# Patient Record
Sex: Female | Born: 2000 | Race: White | Hispanic: No | Marital: Single | Smoking: Never smoker
Health system: Southern US, Community
[De-identification: ages and names within clinical notes are randomized; demographics above are authoritative.]

---
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
## 2023-02-01 ENCOUNTER — Ambulatory Visit (INDEPENDENT_AMBULATORY_CARE_PROVIDER_SITE_OTHER): Payer: BC Managed Care – PPO | Admitting: Obstetrics

## 2023-02-01 ENCOUNTER — Encounter: Payer: Self-pay | Admitting: Obstetrics

## 2023-02-01 VITALS — BP 124/81 | HR 99 | Ht 67.0 in | Wt 135.0 lb

## 2023-02-01 DIAGNOSIS — Z3202 Encounter for pregnancy test, result negative: Secondary | ICD-10-CM | POA: Diagnosis not present

## 2023-02-01 DIAGNOSIS — Z30430 Encounter for insertion of intrauterine contraceptive device: Secondary | ICD-10-CM | POA: Diagnosis not present

## 2023-02-01 DIAGNOSIS — Z7689 Persons encountering health services in other specified circumstances: Secondary | ICD-10-CM

## 2023-02-01 LAB — POCT URINE PREGNANCY: Preg Test, Ur: NEGATIVE

## 2023-02-01 MED ORDER — LEVONORGESTREL 19.5 MG IU IUD: INTRAUTERINE_SYSTEM | Freq: Once | INTRAUTERINE | Status: AC

## 2023-02-01 NOTE — Progress Notes (Signed)
   ENCOUNTER FOR IUD INSERTION   Subjective  Bianca Clark is a 22 y.o. [id number] who presents today for IUD insertion. She desires reversible long-term contraception. We have thoroughly reviewed the risks, benefits, and alternatives, and she has elected to proceed with  [facility]  insertion.   Objective BP 124/81   Pulse 99   Ht 5\' 7"  (1.702 m)   Wt 135 lb (61.2 kg)   LMP 01/18/2023   BMI 21.14 kg/m   UPT: negative Pelvic exam: normal external genitalia, vulva, vagina, cervix, uterus and adnexa, VULVA: normal appearing vulva with no masses, tenderness or lesions, VAGINA: normal appearing vagina with normal color and discharge, no lesions, CERVIX: normal appearing cervix without discharge or lesions, UTERUS: uterus is normal size, shape, consistency and nontender.   Procedure Note Consent was obtained prior to the procedure. A bimanual exam was performed to determine the position of the uterus. A sterile speculum was placed in the vagina, and the cervix was visualized. Betadine was applied to the cervix followed by 4% lidocaine. A single-toothed tenaculum was placed on the anterior lip of the cervix, and gentle traction was applied to straighten and stabilize it. The uterus was sounded to about 6.5 cm. The IUD was inserted to the appropriate depth and the insertion tool was removed. The strings were trimmed to about 2 cm. Bleeding was minimal. [name] tolerated the procedure well. Post-procedure care and warning signs were reviewed with [name].  Follow up for annual visit or PRN.  [name] Spanish, CNM

## 2024-01-13 ENCOUNTER — Emergency Department

## 2024-01-13 ENCOUNTER — Other Ambulatory Visit: Payer: Self-pay

## 2024-01-13 ENCOUNTER — Emergency Department
Admission: EM | Admit: 2024-01-13 | Discharge: 2024-01-13 | Disposition: A | Discharge status: Home / Self Care | Attending: Emergency Medicine | Admitting: Emergency Medicine

## 2024-01-13 DIAGNOSIS — Y908 Blood alcohol level of 240 mg/100 ml or more: Secondary | ICD-10-CM | POA: Insufficient documentation

## 2024-01-13 DIAGNOSIS — F10920 Alcohol use, unspecified with intoxication, uncomplicated: Secondary | ICD-10-CM | POA: Insufficient documentation

## 2024-01-13 DIAGNOSIS — R4182 Altered mental status, unspecified: Secondary | ICD-10-CM | POA: Diagnosis present

## 2024-01-13 LAB — HEPATIC FUNCTION PANEL
ALT: 16 U/L (ref 0–44)
AST: 21 U/L (ref 15–41)
Albumin: 4.3 g/dL (ref 3.5–5.0)
Alkaline Phosphatase: 55 U/L (ref 38–126)
Bilirubin, Direct: <0.1 mg/dL (ref 0.0–0.2)
Total Bilirubin: 0.6 mg/dL (ref 0.0–1.2)
Total Protein: 7.5 g/dL (ref 6.5–8.1)

## 2024-01-13 LAB — BASIC METABOLIC PANEL WITH GFR
Anion gap: 12 (ref 5–15)
BUN: 9 mg/dL (ref 6–20)
CO2: 20 mmol/L — ABNORMAL LOW (ref 22–32)
Calcium: 8.9 mg/dL (ref 8.9–10.3)
Chloride: 109 mmol/L (ref 98–111)
Creatinine, Ser: 0.53 mg/dL (ref 0.44–1.00)
GFR, Estimated: >60 mL/min (ref >60)
Glucose, Bld: 116 mg/dL — ABNORMAL HIGH (ref 70–99)
Potassium: 3.3 mmol/L — ABNORMAL LOW (ref 3.5–5.1)
Sodium: 141 mmol/L (ref 135–145)

## 2024-01-13 LAB — CBC WITH DIFFERENTIAL/PLATELET
Abs Immature Granulocytes: 0.02 K/uL (ref 0.00–0.07)
Basophils Absolute: 0 K/uL (ref 0.0–0.1)
Basophils Relative: 1 %
Eosinophils Absolute: 0 K/uL (ref 0.0–0.5)
Eosinophils Relative: 1 %
HCT: 36.6 % (ref 36.0–46.0)
Hemoglobin: 12.8 g/dL (ref 12.0–15.0)
Immature Granulocytes: 0 %
Lymphocytes Relative: 52 %
Lymphs Abs: 3.7 K/uL (ref 0.7–4.0)
MCH: 31.6 pg (ref 26.0–34.0)
MCHC: 35 g/dL (ref 30.0–36.0)
MCV: 90.4 fL (ref 80.0–100.0)
Monocytes Absolute: 0.3 K/uL (ref 0.1–1.0)
Monocytes Relative: 5 %
Neutro Abs: 2.8 K/uL (ref 1.7–7.7)
Neutrophils Relative %: 41 %
Platelets: 268 K/uL (ref 150–400)
RBC: 4.05 MIL/uL (ref 3.87–5.11)
RDW: 11.8 % (ref 11.5–15.5)
WBC: 6.9 K/uL (ref 4.0–10.5)
nRBC: 0 % (ref 0.0–0.2)

## 2024-01-13 LAB — ETHANOL: Alcohol, Ethyl (B): 290 mg/dL — ABNORMAL HIGH (ref <15)

## 2024-01-13 LAB — LIPASE, BLOOD: Lipase: 60 U/L — ABNORMAL HIGH (ref 11–51)

## 2024-01-13 LAB — HCG, QUANTITATIVE, PREGNANCY: hCG, Beta Chain, Quant, S: <1 m[IU]/mL (ref <5)

## 2024-01-13 MED ORDER — ONDANSETRON HCL 4 MG/2ML IJ SOLN
4.0000 mg | Freq: Once | INTRAMUSCULAR | Status: AC
  Administered 2024-01-13: 4 mg via INTRAVENOUS
  Filled 2024-01-13: qty 2

## 2024-01-13 MED ORDER — SODIUM CHLORIDE 0.9 % IV BOLUS
1000.0000 mL | Freq: Once | INTRAVENOUS | Status: AC
  Administered 2024-01-13: 1000 mL via INTRAVENOUS
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
